# Patient Record
Sex: Male | Born: 1980 | Race: White | Hispanic: No | Marital: Married | Smoking: Never smoker
Health system: Southern US, Community
[De-identification: ages and names within clinical notes are randomized; demographics above are authoritative.]

---

## 2009-06-13 ENCOUNTER — Emergency Department: Payer: Self-pay | Admitting: Internal Medicine

## 2011-04-11 ENCOUNTER — Emergency Department: Payer: Self-pay | Admitting: Emergency Medicine

## 2017-02-09 ENCOUNTER — Ambulatory Visit
Admission: RE | Admit: 2017-02-09 | Discharge: 2017-02-09 | Disposition: A | Discharge status: Home / Self Care | Payer: Managed Care, Other (non HMO) | Source: Ambulatory Visit | Attending: Physician Assistant | Admitting: Physician Assistant

## 2017-02-09 ENCOUNTER — Ambulatory Visit: Payer: Managed Care, Other (non HMO) | Admitting: Physician Assistant

## 2017-02-09 ENCOUNTER — Encounter: Payer: Self-pay | Admitting: Physician Assistant

## 2017-02-09 VITALS — BP 122/80 | HR 80 | Temp 98.5°F | Resp 16 | Ht 69.0 in | Wt 220.0 lb

## 2017-02-09 DIAGNOSIS — M545 Low back pain, unspecified: Secondary | ICD-10-CM

## 2017-02-09 DIAGNOSIS — Z131 Encounter for screening for diabetes mellitus: Secondary | ICD-10-CM | POA: Diagnosis not present

## 2017-02-09 DIAGNOSIS — M5136 Other intervertebral disc degeneration, lumbar region: Secondary | ICD-10-CM | POA: Insufficient documentation

## 2017-02-09 DIAGNOSIS — H5500 Unspecified nystagmus: Secondary | ICD-10-CM

## 2017-02-09 DIAGNOSIS — E7800 Pure hypercholesterolemia, unspecified: Secondary | ICD-10-CM | POA: Diagnosis not present

## 2017-02-09 DIAGNOSIS — Z1329 Encounter for screening for other suspected endocrine disorder: Secondary | ICD-10-CM

## 2017-02-09 DIAGNOSIS — G8929 Other chronic pain: Secondary | ICD-10-CM

## 2017-02-09 DIAGNOSIS — Z0000 Encounter for general adult medical examination without abnormal findings: Secondary | ICD-10-CM

## 2017-02-09 DIAGNOSIS — Z23 Encounter for immunization: Secondary | ICD-10-CM | POA: Diagnosis not present

## 2017-02-09 DIAGNOSIS — Z114 Encounter for screening for human immunodeficiency virus [HIV]: Secondary | ICD-10-CM | POA: Diagnosis not present

## 2017-02-09 NOTE — Progress Notes (Signed)
Patient: Donald Walters Male    DOB: 10/30/1980   36 y.o.   MRN: [id number] Visit Date: 02/09/2017  Today's Provider: Trey SailorsAdriana M Pollak, PA-C   Chief Complaint  Patient presents with  . New Patient (Initial Visit)  . Back Pain   Subjective:    Donald Walters is a 36 y/o man who comes in today to establish care with the practice. He was previously [facility] by [facility] in [facility]. Hasn't been seen in 3 years due to insurance issues.  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 He lives in [place] with his wife of 10 years. He has three children, ages 6512, 2, and 8 weeks.  Marland Kitchen.  He drinks socially, smokes socially. Does not use drugs. Is sexually active, no concern for STI.  He has a history of gastric artery embolization around 2006-[facility].    He denies history of colon cancer and prostate cancer.   His father died of MI at age 38. He himself has a history of high cholesterol and was considered for statin at one point.    Back Pain  This is a new problem. The current episode started more than 1 month ago (at least 2 months). The problem occurs every several days. The problem is unchanged. The quality of the pain is described as aching and burning. The pain does not radiate. The pain is worse during the night. The symptoms are aggravated by sitting.   Patient is a little Building services engineerleague coach and he does a lot of physical activity. He reports that the pain in his lower back is progressively getting worse. He locates the pain on his right side, over his SI joint. He has been taking Tylenol, ibuprofen, and daily back stretches to help with the pain, not much relief. He reports pain is worse when he is sitting or lying for long periods.     Allergies no known allergies  No current outpatient medications on file.  Review of Systems  Constitutional: Negative.   HENT: Negative.   Eyes: Negative.   Respiratory: Negative.   Cardiovascular: Negative.     Gastrointestinal: Negative.   Endocrine: Negative.   Genitourinary: Negative.   Musculoskeletal: Positive for back pain.  Skin: Negative.   Allergic/Immunologic: Negative.   Neurological: Negative.   Hematological: Negative.   Psychiatric/Behavioral: Negative.     Social History   Tobacco Use  . Smoking status: Never Smoker  . Smokeless tobacco: Never Used  Substance Use Topics  . Alcohol use: No    Frequency: Never   Objective:   BP 122/80 (BP Location: Right Arm, Patient Position: Sitting, Cuff Size: Normal)   Pulse 80   Temp 98.5 F (36.9 C)   Resp 16   Ht 5\' 9"  (1.753 m)   Wt 220 lb (99.8 kg)   SpO2 99%   BMI 32.49 kg/m  Vitals:   02/09/17 1529  BP: 122/80  Pulse: 80  Resp: 16  Temp: 98.5 F (36.9 C)  SpO2: 99%  Weight: 220 lb (99.8 kg)  Height: 5\' 9"  (1.753 m)     Physical Exam  Constitutional: He is oriented to person, place, and time. He appears well-developed and well-nourished.  HENT:  Right Ear: Tympanic membrane and external ear normal.  Left Ear: Tympanic membrane and external ear normal.  Mouth/Throat: Oropharynx is clear and moist. No oropharyngeal exudate.  Neck: Neck supple.  Cardiovascular: Normal rate and regular rhythm.  Pulmonary/Chest: Effort  normal and breath sounds normal.  Abdominal: Soft. Bowel sounds are normal.  Musculoskeletal: Normal range of motion. He exhibits no edema, tenderness or deformity.       Lumbar back: Normal.  Lymphadenopathy:    He has no cervical adenopathy.  Neurological: He is alert and oriented to person, place, and time.  Skin: Skin is warm and dry.  Psychiatric: He has a normal mood and affect. His behavior is normal.        Assessment & Plan:     1. Annual physical exam  - CBC with Differential  2. Hypercholesterolemia  - Lipid Profile  3. Nystagmus   4. Influenza vaccination administered at current visit  - Flu Vaccine QUAD 6+ mos PF IM (Fluarix Quad PF)  5. Diabetes mellitus  screening  - Comprehensive Metabolic Panel (CMET)  6. Thyroid disorder screening  - TSH  7. Chronic right-sided low back pain without sciatica  - DG Lumbar Spine Complete; Future  8. Encounter for screening for HIV  - HIV antibody (with reflex)  Return in about 1 year (around 02/09/2018) for CPE.  The entirety of the information documented in the History of Present Illness, Review of Systems and Physical Exam were personally obtained by me. Portions of this information were initially documented by Anson [place]achelle Presley, CMA and reviewed by me for thoroughness and accuracy.        Trey SailorsAdriana M Pollak, PA-C  [facility]

## 2017-02-09 NOTE — Patient Instructions (Signed)

## 2017-02-13 DIAGNOSIS — E7800 Pure hypercholesterolemia, unspecified: Secondary | ICD-10-CM | POA: Insufficient documentation

## 2017-02-13 DIAGNOSIS — H5500 Unspecified nystagmus: Secondary | ICD-10-CM | POA: Insufficient documentation

## 2017-02-14 ENCOUNTER — Telehealth: Payer: Self-pay

## 2017-02-14 DIAGNOSIS — G8929 Other chronic pain: Secondary | ICD-10-CM

## 2017-02-14 DIAGNOSIS — M545 Low back pain, unspecified: Secondary | ICD-10-CM

## 2017-02-14 NOTE — Telephone Encounter (Signed)
Advised patient of results. Patient was wanting to know if he could be referred to ortho for his pain? He reports that he experiences 8/10 pain on a daily basis and wants to discuss treatment options with a specialist. Ok to refer?

## 2017-02-14 NOTE — Telephone Encounter (Signed)
Patient states he has no preference for Orthopedist.

## 2017-02-14 NOTE — Telephone Encounter (Signed)
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

## 2017-02-14 NOTE — Telephone Encounter (Signed)
Yes he may be referred. Does he have preference? If not, will refer generally.

## 2017-02-14 NOTE — Telephone Encounter (Signed)
Referral to orthopedics placed for back pain.

## 2017-06-10 ENCOUNTER — Emergency Department: Payer: Managed Care, Other (non HMO)

## 2017-06-10 ENCOUNTER — Emergency Department
Admission: EM | Admit: 2017-06-10 | Discharge: 2017-06-10 | Disposition: A | Discharge status: Home / Self Care | Payer: Managed Care, Other (non HMO) | Attending: Emergency Medicine | Admitting: Emergency Medicine

## 2017-06-10 ENCOUNTER — Encounter: Payer: Self-pay | Admitting: Emergency Medicine

## 2017-06-10 DIAGNOSIS — R1011 Right upper quadrant pain: Secondary | ICD-10-CM | POA: Diagnosis present

## 2017-06-10 DIAGNOSIS — N201 Calculus of ureter: Secondary | ICD-10-CM | POA: Insufficient documentation

## 2017-06-10 LAB — COMPREHENSIVE METABOLIC PANEL
ALBUMIN: 4.6 g/dL (ref 3.5–5.0)
ALK PHOS: 50 U/L (ref 38–126)
ALT: 45 U/L (ref 17–63)
AST: 34 U/L (ref 15–41)
Anion gap: 8 (ref 5–15)
BUN: 20 mg/dL (ref 6–20)
CALCIUM: 9 mg/dL (ref 8.9–10.3)
CO2: 25 mmol/L (ref 22–32)
CREATININE: 1.3 mg/dL — AB (ref 0.61–1.24)
Chloride: 106 mmol/L (ref 101–111)
GFR calc non Af Amer: >60 mL/min (ref >60)
GLUCOSE: 139 mg/dL — AB (ref 65–99)
Potassium: 3.8 mmol/L (ref 3.5–5.1)
SODIUM: 139 mmol/L (ref 135–145)
Total Bilirubin: 0.6 mg/dL (ref 0.3–1.2)
Total Protein: 7.1 g/dL (ref 6.5–8.1)

## 2017-06-10 LAB — CBC
HCT: 43.6 % (ref 40.0–52.0)
Hemoglobin: 14.6 g/dL (ref 13.0–18.0)
MCH: 29.2 pg (ref 26.0–34.0)
MCHC: 33.4 g/dL (ref 32.0–36.0)
MCV: 87.4 fL (ref 80.0–100.0)
PLATELETS: 261 10*3/uL (ref 150–440)
RBC: 4.98 MIL/uL (ref 4.40–5.90)
RDW: 13.8 % (ref 11.5–14.5)
WBC: 13.1 10*3/uL — ABNORMAL HIGH (ref 3.8–10.6)

## 2017-06-10 MED ORDER — ONDANSETRON HCL 4 MG PO TABS: 4.0000 mg | ORAL_TABLET | Freq: Once | ORAL | Status: DC

## 2017-06-10 MED ORDER — ONDANSETRON 4 MG PO TBDP: 4.0000 mg | ORAL_TABLET | Freq: Three times a day (TID) | ORAL | 0 refills | Status: AC | PRN

## 2017-06-10 MED ORDER — ONDANSETRON 4 MG PO TBDP
ORAL_TABLET | ORAL | Status: AC
  Administered 2017-06-10: 4 mg via ORAL
  Filled 2017-06-10: qty 1

## 2017-06-10 MED ORDER — ONDANSETRON 4 MG PO TBDP
4.0000 mg | ORAL_TABLET | Freq: Once | ORAL | Status: AC
  Administered 2017-06-10: 4 mg via ORAL

## 2017-06-10 MED ORDER — SODIUM CHLORIDE 0.9 % IV SOLN
1000.0000 mL | Freq: Once | INTRAVENOUS | Status: AC
  Administered 2017-06-10: 1000 mL via INTRAVENOUS

## 2017-06-10 MED ORDER — OXYCODONE-ACETAMINOPHEN 5-325 MG PO TABS
1.0000 | ORAL_TABLET | ORAL | Status: DC | PRN
  Administered 2017-06-10: 1 via ORAL
  Filled 2017-06-10: qty 1

## 2017-06-10 MED ORDER — HYDROCODONE-ACETAMINOPHEN 5-325 MG PO TABS: 1.0000 | ORAL_TABLET | ORAL | 0 refills | Status: AC | PRN

## 2017-06-10 MED ORDER — NAPROXEN 500 MG PO TABS: 500.0000 mg | ORAL_TABLET | Freq: Two times a day (BID) | ORAL | 2 refills | Status: AC

## 2017-06-10 MED ORDER — KETOROLAC TROMETHAMINE 30 MG/ML IJ SOLN
30.0000 mg | Freq: Once | INTRAMUSCULAR | Status: AC
  Administered 2017-06-10: 30 mg via INTRAVENOUS
  Filled 2017-06-10: qty 1

## 2017-06-10 NOTE — ED Provider Notes (Signed)
[facility] Emergency Department Provider Note   ____________________________________________    I have reviewed the triage vital signs and the nursing notes.   HISTORY  Chief Complaint Flank Pain     HPI Donald Walters is a 37 y.o. male who presents with complaints of right flank pain.  Patient reports at approximately 4 AM he woke up with pain in his right flank which he reports was moderate to severe and radiated into his groin.  He has never had this before.  He does not believe that he has had hematuria.  Denies fevers or chills.  Did not take anything at home, came to the emergency department.  Given Zofran and Percocet in the waiting room and does feel somewhat better now.  No dysuria   History reviewed. No pertinent past medical history.  Patient Active Problem List   Diagnosis Date Noted  . Nystagmus 02/13/2017  . Hypercholesterolemia 02/13/2017    History reviewed. No pertinent surgical history.  Prior to Admission medications   Medication Sig Start Date End Date Taking? Authorizing Provider  HYDROcodone-acetaminophen (NORCO/VICODIN) 5-325 MG tablet Take 1 tablet by mouth every 4 (four) hours as needed for moderate pain. 06/10/17   Jene Every, MD  naproxen (NAPROSYN) 500 MG tablet Take 1 tablet (500 mg total) by mouth 2 (two) times daily with a meal. 06/10/17   Jene Every, MD  ondansetron (ZOFRAN ODT) 4 MG disintegrating tablet Take 1 tablet (4 mg total) by mouth every 8 (eight) hours as needed for nausea or vomiting. 06/10/17   Jene Every, MD     Allergies Patient has no known allergies.  No family history on file.  Social History Social History   Tobacco Use  . Smoking status: Never Smoker  . Smokeless tobacco: Never Used  Substance Use Topics  . Alcohol use: No    Frequency: Never  . Drug use: No    Review of Systems  Constitutional: No fever/chills Eyes: No visual changes.  ENT: No sore throat. Cardiovascular:  Denies chest pain. Respiratory: Denies shortness of breath. Gastrointestinal: Positive flank pain as above, positive nausea and vomiting.   Genitourinary: Negative for dysuria. Musculoskeletal: Negative for back pain. Skin: Negative for rash. Neurological: Negative for headaches or weakness   ____________________________________________   PHYSICAL EXAM:  VITAL SIGNS: ED Triage Vitals  Enc Vitals Group     BP 06/10/17 0424 (!) 144/96     Pulse Rate 06/10/17 0424 68     Resp 06/10/17 0424 20     Temp --      Temp src --      SpO2 06/10/17 0424 98 %     Weight 06/10/17 0422 99.8 kg (220 lb)     Height 06/10/17 0422 1.753 m (5\' 9" )     Head Circumference --      Peak Flow --      Pain Score 06/10/17 0422 9     Pain Loc --      Pain Edu? --      Excl. in GC? --     Constitutional: Alert and oriented. No acute distress. Pleasant and interactive Eyes: Conjunctivae are normal.   Nose: No congestion/rhinnorhea.  Cardiovascular: Normal rate, regular rhythm. Grossly normal heart sounds.  Good peripheral circulation. Respiratory: Normal respiratory effort.  No retractions. Lungs CTAB. Gastrointestinal: Soft and nontender. No distention.  No CVA tenderness. Genitourinary: deferred Musculoskeletal: No lower extremity tenderness nor edema.  Warm and well perfused Neurologic:  Normal speech  and language. No gross focal neurologic deficits are appreciated.  Skin:  Skin is warm, dry and intact. No rash noted. Psychiatric: Mood and affect are normal. Speech and behavior are normal.  ____________________________________________   LABS (all labs ordered are listed, but only abnormal results are displayed)  Labs Reviewed  COMPREHENSIVE METABOLIC PANEL - Abnormal; Notable for the following components:      Result Value   Glucose, Bld 139 (*)    Creatinine, Ser 1.30 (*)    All other components within normal limits  CBC - Abnormal; Notable for the following components:   WBC 13.1  (*)    All other components within normal limits  URINALYSIS, COMPLETE (UACMP) WITH MICROSCOPIC   ____________________________________________  EKG  None ____________________________________________  RADIOLOGY  CT renal stone study ____________________________________________   PROCEDURES  Procedure(s) performed: No  Procedures   Critical Care performed: No ____________________________________________   INITIAL IMPRESSION / ASSESSMENT AND PLAN / ED COURSE  Pertinent labs & imaging results that were available during my care of the patient were reviewed by me and considered in my medical decision making (see chart for details).  Patient presents with abrupt onset right flank pain concerning for ureterolithiasis.  Will check labs, urinalysis, CT renal stone study.  We will give IV Toradol in addition to the Percocet he has already received.  ----------------------------------------- 8:19 AM on 06/10/2017 -----------------------------------------  Patient is pain-free.  CT scan demonstrates stone in the bladder, likely passed and probably the cause of his pain.  Discussed area of decreased attenuation on the CT scan and need for repeat CT scan to the patient he understands ____________________________________________   FINAL CLINICAL IMPRESSION(S) / ED DIAGNOSES  Final diagnoses:  Ureterolithiasis        Note:  This document was prepared using Dragon voice recognition software and may include unintentional dictation errors.    Jene EveryKinner, Margaretha Mahan, MD 06/10/17 [phone]

## 2017-06-10 NOTE — ED Triage Notes (Signed)
Patient with complaint of right flank pain with vomiting that started at 04:00 this morning.

## 2019-05-15 ENCOUNTER — Ambulatory Visit: Payer: Managed Care, Other (non HMO) | Attending: Family

## 2019-05-15 DIAGNOSIS — Z23 Encounter for immunization: Secondary | ICD-10-CM

## 2019-05-15 NOTE — Progress Notes (Signed)
   Covid-19 Vaccination Clinic  Name:  Grae Cannata    MRN: [id number] DOB: 25-Aug-1980  05/15/2019  Mr. Sambrano was observed post Covid-19 immunization for 15 minutes without incident. He was provided with Vaccine Information Sheet and instruction to access the V-Safe system.   Mr. Brailsford was instructed to call 911 with any severe reactions post vaccine: Marland Kitchen Difficulty breathing  . Swelling of face and throat  . A fast heartbeat  . A bad rash all over body  . Dizziness and weakness   Immunizations Administered    Name Date Dose VIS Date Route   Moderna COVID-19 Vaccine 05/15/2019 11:53 AM 0.5 mL 02/04/2019 Intramuscular   Manufacturer: Moderna   Lot: [id number]   NDC: 93968-864-84

## 2019-06-17 ENCOUNTER — Ambulatory Visit: Payer: Managed Care, Other (non HMO) | Attending: Family

## 2019-06-17 DIAGNOSIS — Z23 Encounter for immunization: Secondary | ICD-10-CM

## 2019-06-17 NOTE — Progress Notes (Signed)
   Covid-19 Vaccination Clinic  Name:  Donald Walters    MRN: [id number] DOB: May 24, 1980  06/17/2019  [name] was observed post Covid-19 immunization for 15 minutes without incident. He was provided with Vaccine Information Sheet and instruction to access the V-Safe system.   [name] was instructed to call 911 with any severe reactions post vaccine: Marland Kitchen Difficulty breathing  . Swelling of face and throat  . A fast heartbeat  . A bad rash all over body  . Dizziness and weakness   Immunizations Administered    Name Date Dose VIS Date Route   Moderna COVID-19 Vaccine 06/17/2019  3:40 PM 0.5 mL 02/04/2019 Intramuscular   Manufacturer: Moderna   Lot: [id number]   NDC: 67011-003-49

## 2019-06-20 IMAGING — CT CT RENAL STONE PROTOCOL
2 of 4 series · 15 of 46 positions shown, 17 images · non-contrast
Comparison: None.

CLINICAL DATA: Right flank pain and back pain beginning this
morning.

EXAM:
CT ABDOMEN AND PELVIS WITHOUT CONTRAST
TECHNIQUE: Multidetector CT imaging of the abdomen and pelvis was performed
following the standard protocol without IV contrast.

[Series 2: stone full standard · axial · 0.71mm/px · z∈[-502,-42]mm · 12 of 106 slices shown, 14 images]
[im 9/106  soft-tissue]
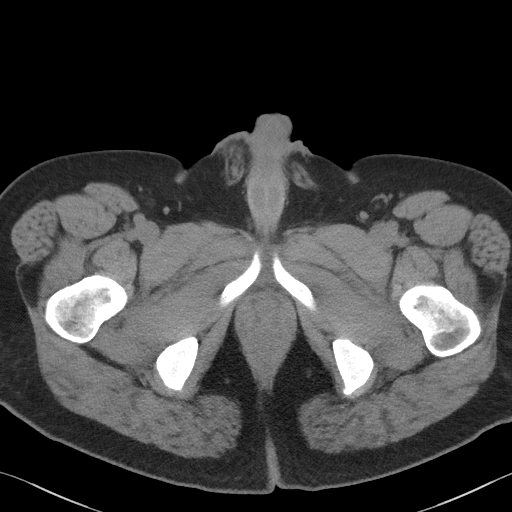
[im 9/106  bone]
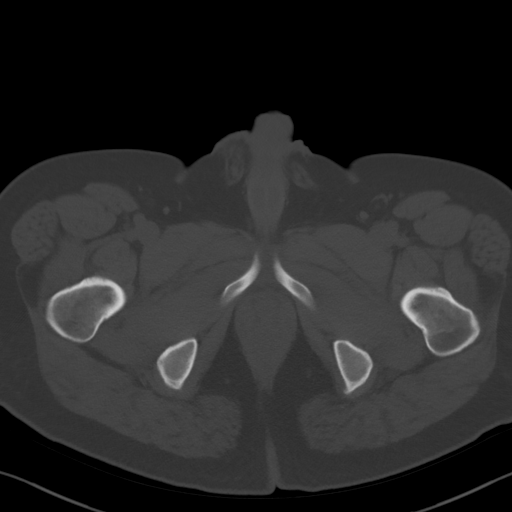
[im 17/106  soft-tissue]
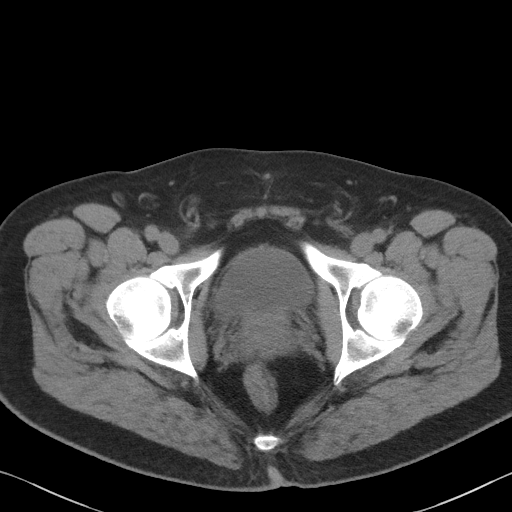
[im 26/106  soft-tissue]
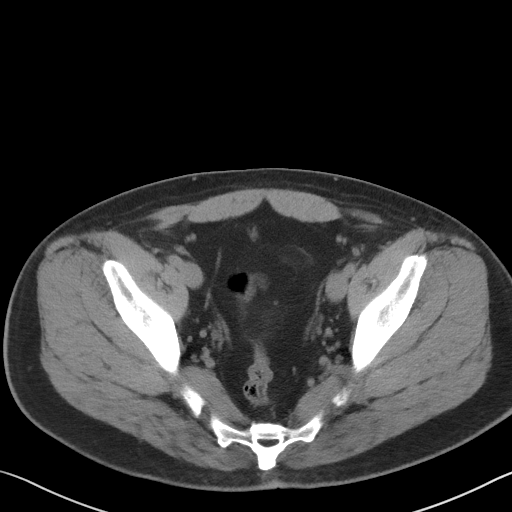
[im 34/106  soft-tissue]
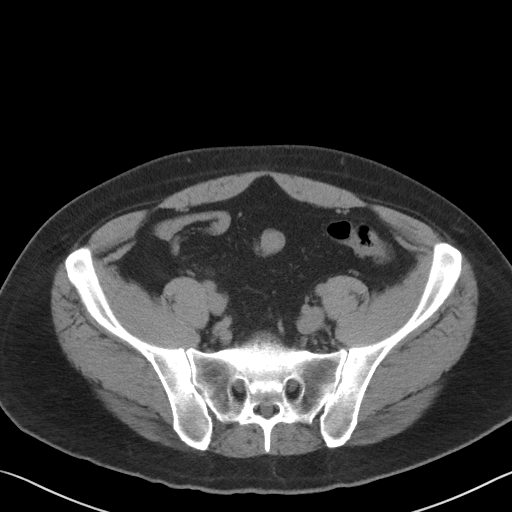
[im 43/106  soft-tissue]
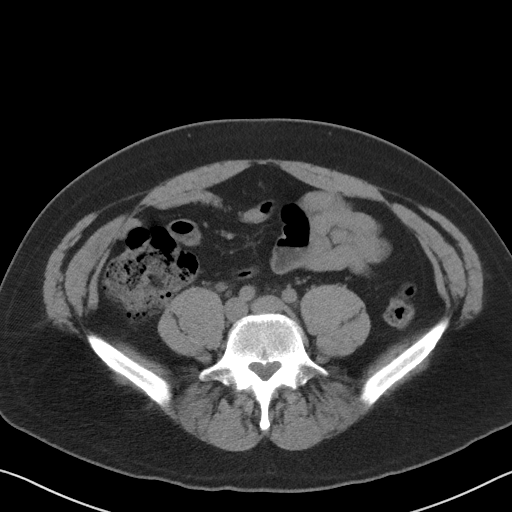
[im 51/106  soft-tissue]
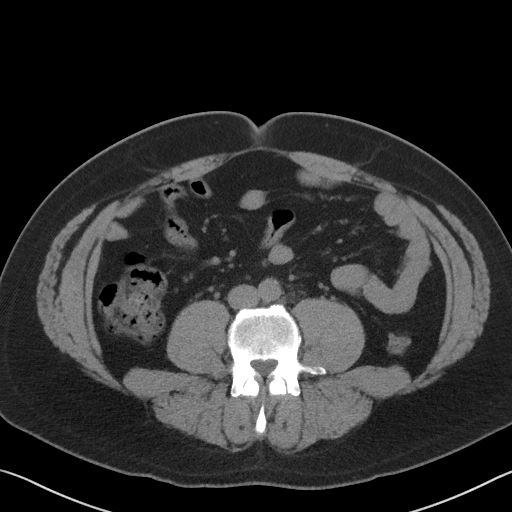
[im 59/106  soft-tissue]
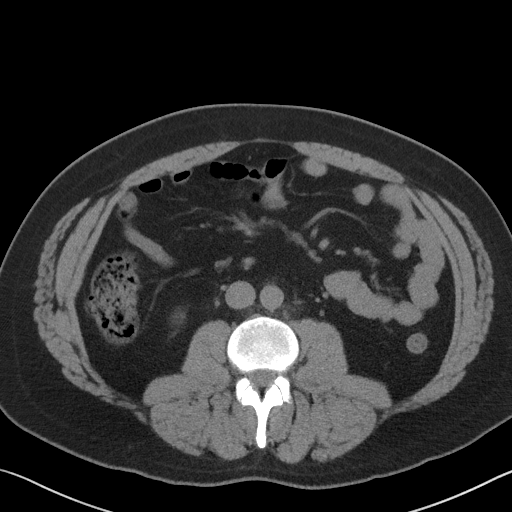
[im 68/106  soft-tissue]
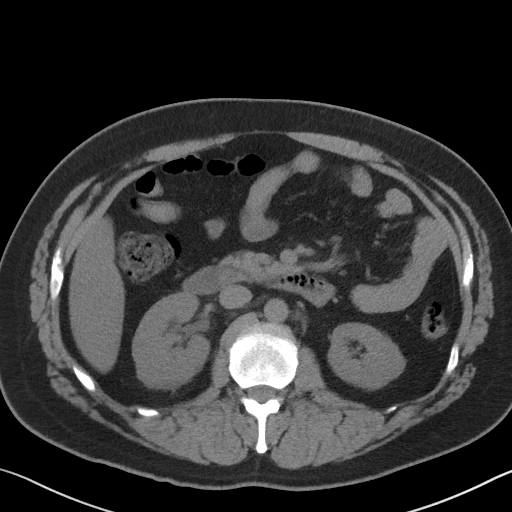
[im 76/106  soft-tissue]
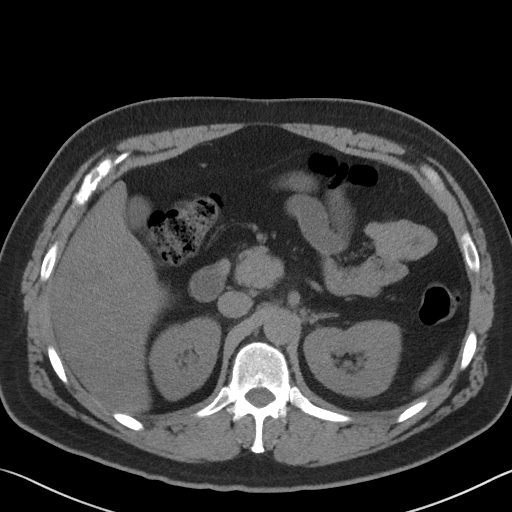
[im 76/106  bone]
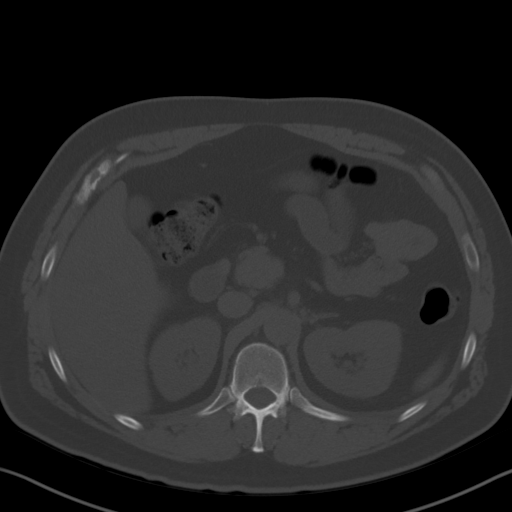
[im 85/106  soft-tissue]
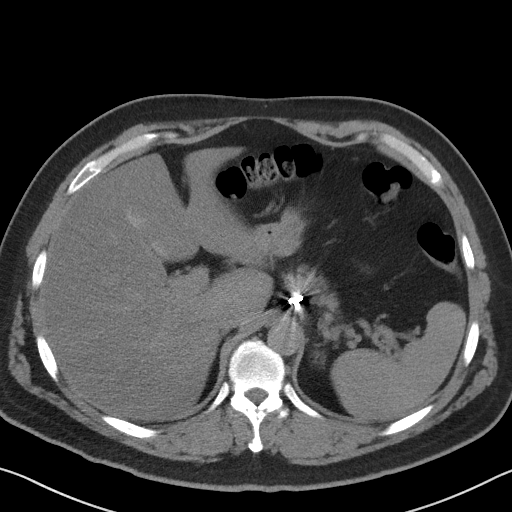
[im 93/106  soft-tissue]
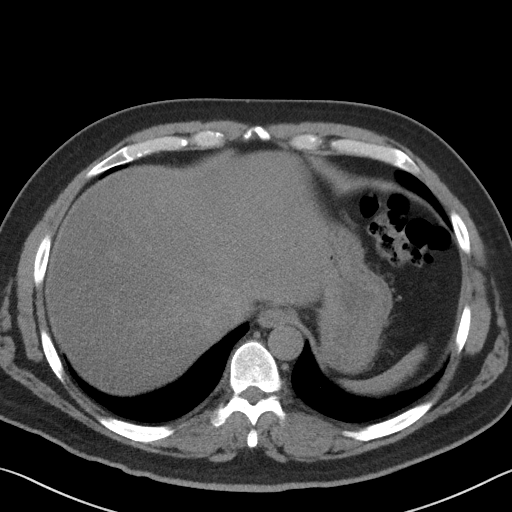
[im 101/106  soft-tissue]
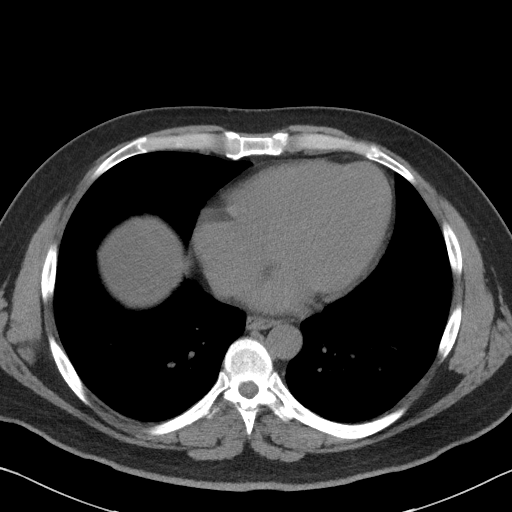

[Series 5: coronal · coronal · 0.74mm/px · 3 of 143 slices shown]
[im 48/143  soft-tissue]
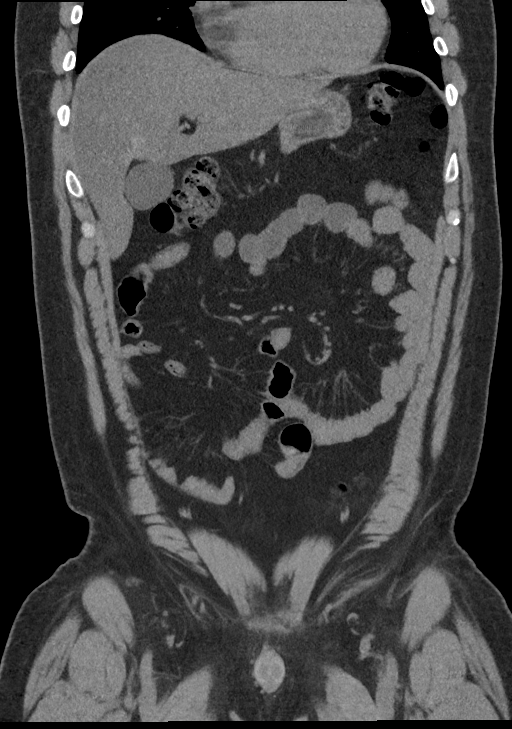
[im 64/143  soft-tissue]
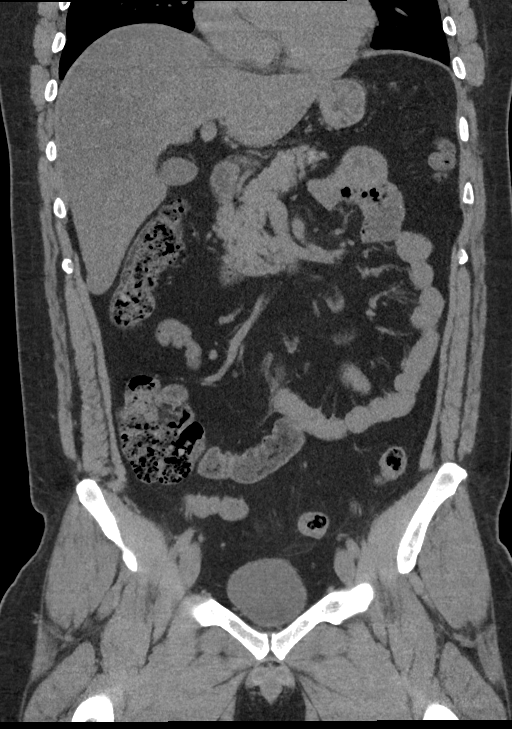
[im 79/143  soft-tissue]
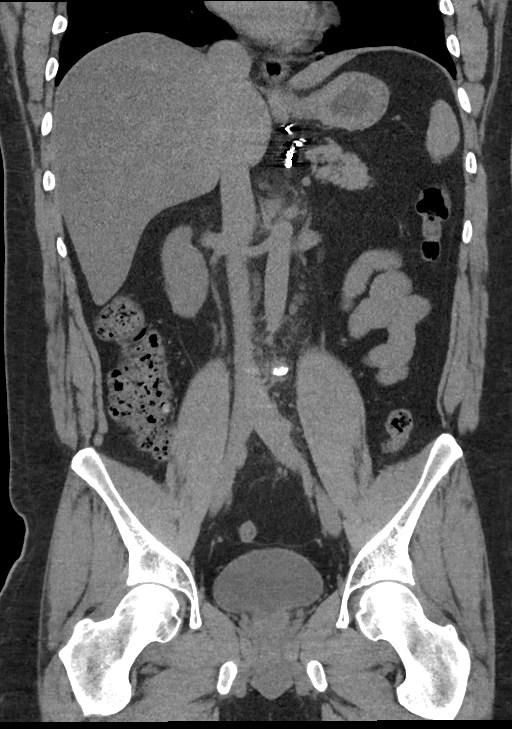

[15 of 46 positions shown; findings below may reference images not displayed]

FINDINGS: Lower chest: No acute abnormality.

Hepatobiliary: Hepatic steatosis is identified. The liver and
gallbladder are otherwise normal.

Pancreas: Unremarkable. No pancreatic ductal dilatation or
surrounding inflammatory changes.

Spleen: Normal in size without focal abnormality.

Adrenals/Urinary Tract: The adrenal glands are normal. There is a
small stone in the upper pole left kidney measuring 2 or 3 mm. No
other renal stones. No hydronephrosis or perinephric stranding. No
ureterectasis or ureteral stone. There is a small 2 or 3 mm stone
posteriorly in the bladder, possibly a recently passed stone given
history. The bladder is otherwise normal.

Stomach/Bowel: The stomach and small bowel are normal. The: And
appendix are normal.

Vascular/Lymphatic: The abdominal aorta is normal in caliber. No
atherosclerotic change. There is increased attenuation in the fat of
the retroperitoneum around the aorta. The aorta itself is normal in
appearance and this increased attenuation of fat is not thought to
be acute but is nonspecific. No lymphadenopathy.

Reproductive: Prostate is unremarkable.

Other: No free air or free fluid.

Musculoskeletal: Mild anterior wedging of L1 has a nonacute
appearance. Mild degenerative changes in the lumbar and lower
thoracic spine.
IMPRESSION: 1. There is a small stone posteriorly in the bladder. Given history,
this may represent a recently passed stone. There is a small
nonobstructive stone in the left kidney.
2. There is increased attenuation in the retroperitoneum adjacent to
the aorta. However, I suspect this is not an acute finding and there
are no other signs to suspect aortitis or vasculitis. The findings
are not specific. Recommend a follow-up CT scan in 3-6 months to
ensure resolution or stability.
3. Anterior wedging of L1 is favored to be nonacute. Recommend
clinical correlation.
